# Patient Record
Sex: Female | Born: 1970 | Race: White | Hispanic: No | Marital: Single
Health system: Southern US, Community
[De-identification: ages and names within clinical notes are randomized; demographics above are authoritative.]

---

## 2011-04-01 ENCOUNTER — Inpatient Hospital Stay: Payer: Self-pay | Admitting: Unknown Physician Specialty

## 2011-07-06 ENCOUNTER — Emergency Department: Payer: Self-pay | Admitting: *Deleted

## 2011-09-15 ENCOUNTER — Emergency Department: Payer: Self-pay | Admitting: Emergency Medicine

## 2011-09-15 LAB — DRUG SCREEN, URINE
Amphetamines, Ur Screen: NEGATIVE (ref ?–1000)
Benzodiazepine, Ur Scrn: NEGATIVE (ref ?–200)
Cocaine Metabolite,Ur ~~LOC~~: NEGATIVE (ref ?–300)
MDMA (Ecstasy)Ur Screen: NEGATIVE (ref ?–500)
Opiate, Ur Screen: NEGATIVE (ref ?–300)
Phencyclidine (PCP) Ur S: NEGATIVE (ref ?–25)
Tricyclic, Ur Screen: NEGATIVE (ref ?–1000)

## 2011-09-15 LAB — ACETAMINOPHEN LEVEL: Acetaminophen: <2 ug/mL

## 2011-09-15 LAB — CBC
HCT: 43.1 % (ref 35.0–47.0)
MCH: 30.5 pg (ref 26.0–34.0)
MCHC: 33.4 g/dL (ref 32.0–36.0)
MCV: 91 fL (ref 80–100)
Platelet: 288 10*3/uL (ref 150–440)
RBC: 4.73 10*6/uL (ref 3.80–5.20)
WBC: 6.8 10*3/uL (ref 3.6–11.0)

## 2011-09-15 LAB — COMPREHENSIVE METABOLIC PANEL
Anion Gap: 11 (ref 7–16)
BUN: 12 mg/dL (ref 7–18)
Bilirubin,Total: 0.3 mg/dL (ref 0.2–1.0)
Calcium, Total: 9.4 mg/dL (ref 8.5–10.1)
Chloride: 101 mmol/L (ref 98–107)
Co2: 26 mmol/L (ref 21–32)
Creatinine: 0.82 mg/dL (ref 0.60–1.30)
EGFR (African American): >60
EGFR (Non-African Amer.): >60
Osmolality: 275 (ref 275–301)
Potassium: 4.4 mmol/L (ref 3.5–5.1)
Sodium: 138 mmol/L (ref 136–145)
Total Protein: 8 g/dL (ref 6.4–8.2)

## 2011-09-15 LAB — ETHANOL: Ethanol %: <0.003 % (ref 0.000–0.080)

## 2011-09-15 LAB — TSH: Thyroid Stimulating Horm: 2.14 u[IU]/mL

## 2012-08-21 IMAGING — CT CT HEAD WITHOUT CONTRAST
2 series · 16 of 30 positions shown, 20 images · non-contrast
Comparison: none

REASON FOR EXAM: + syncope
COMMENTS:

PROCEDURE:     CT  - CT HEAD WITHOUT CONTRAST  - July 06, 2011 [DATE]
RESULT:     Technique: Helical 5mm sections were obtained from the skull
base to the vertex without administration of intravenous contrast.

[Series 2: without · axial · non-contrast · 0.42mm/px · z∈[-33,+87]mm · 13 of 30 slices shown, 17 images]
[im 3/30  brain]
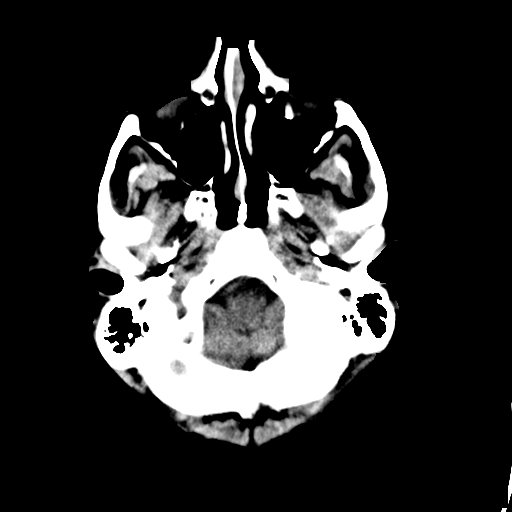
[im 3/30  bone]
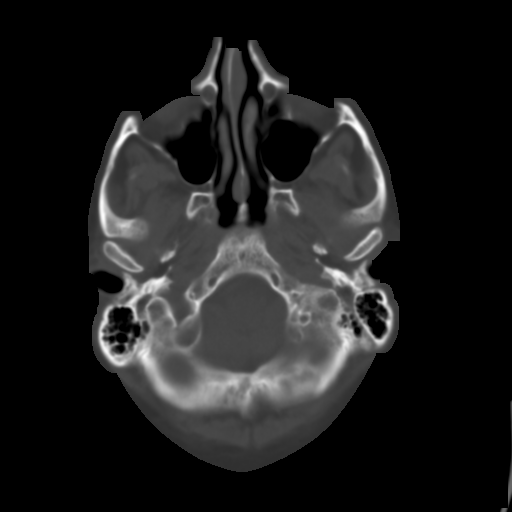
[im 5/30  brain]
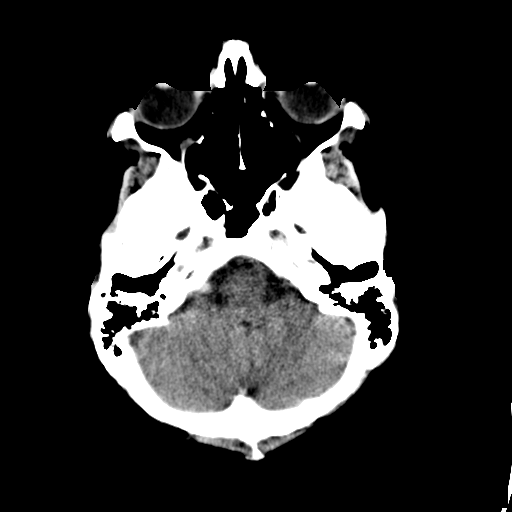
[im 7/30  brain]
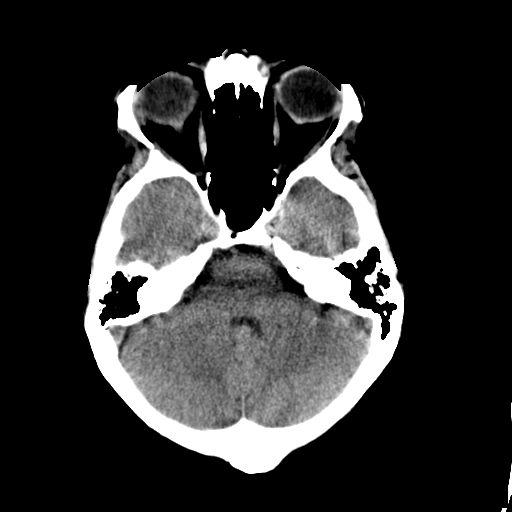
[im 9/30  brain]
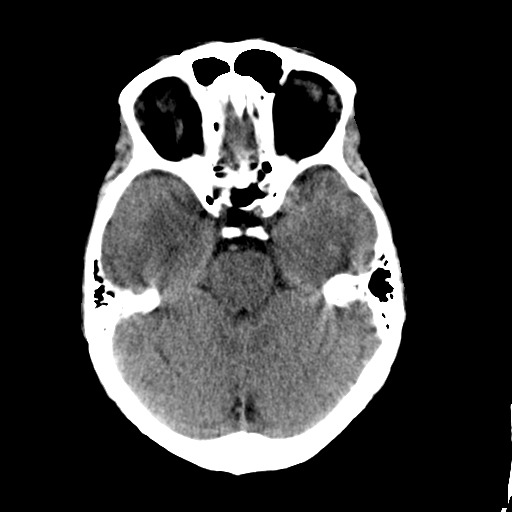
[im 11/30  brain]
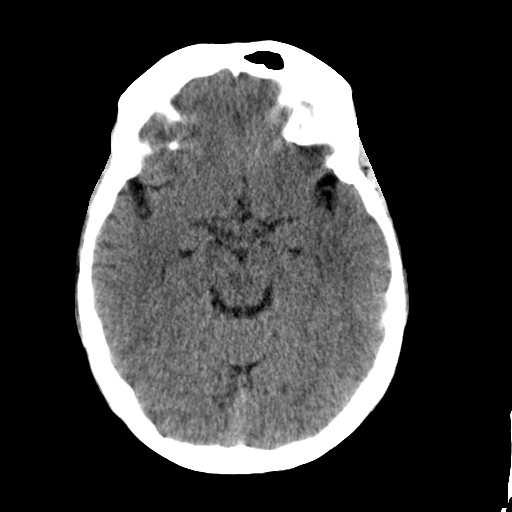
[im 11/30  bone]
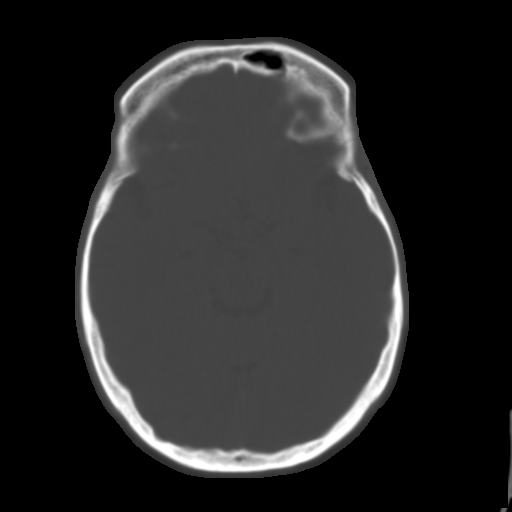
[im 13/30  brain]
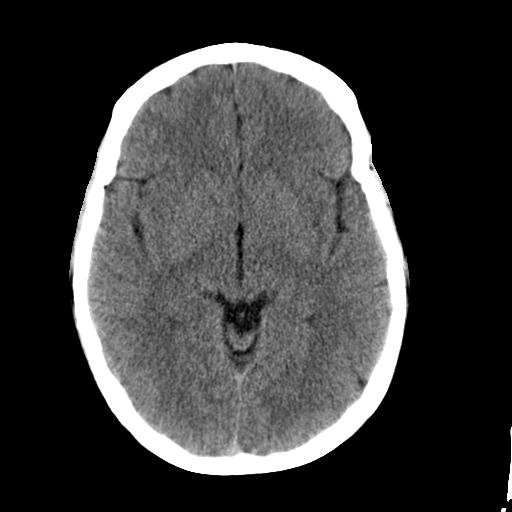
[im 15/30  brain]
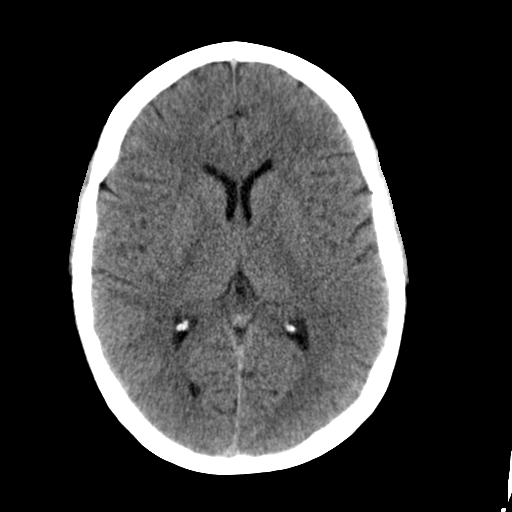
[im 17/30  brain]
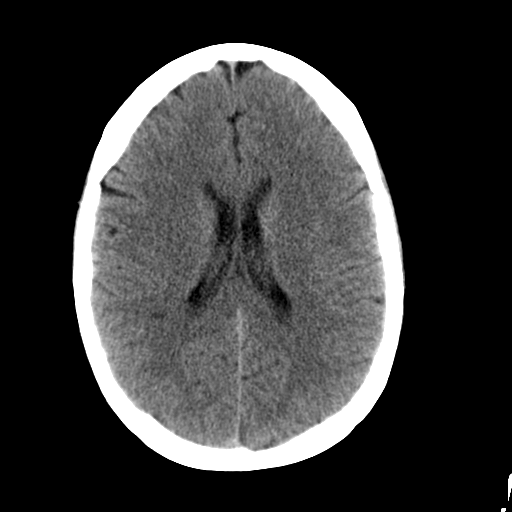
[im 19/30  brain]
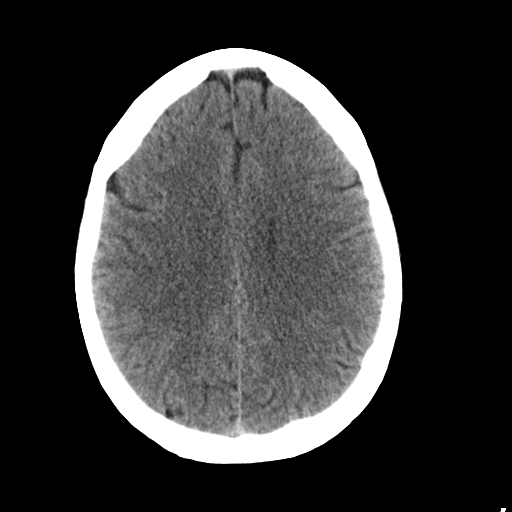
[im 19/30  bone]
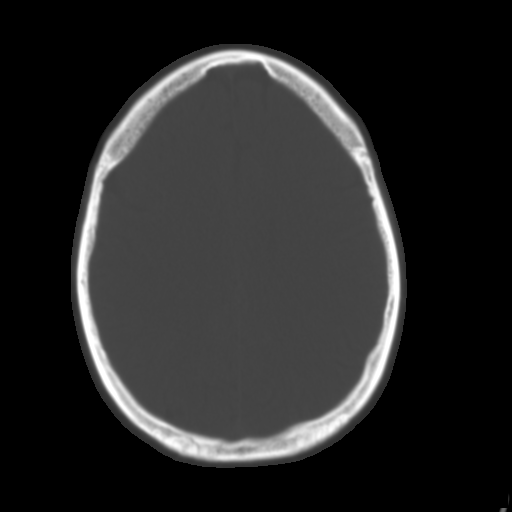
[im 21/30  brain]
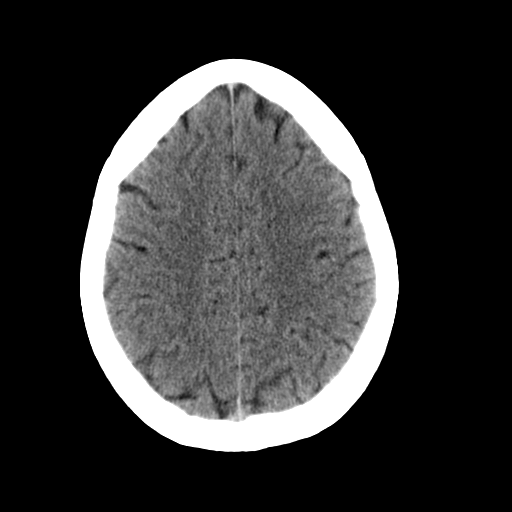
[im 23/30  brain]
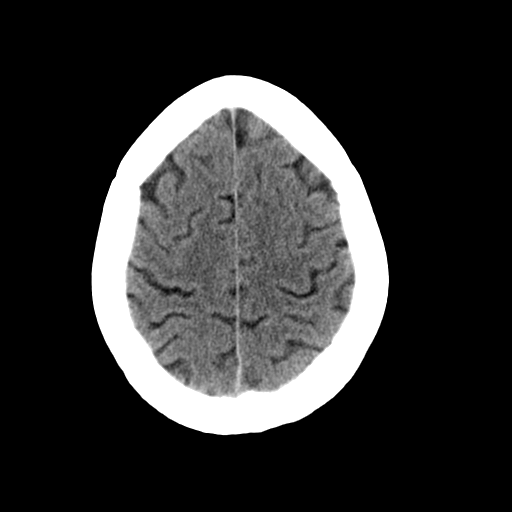
[im 25/30  brain]
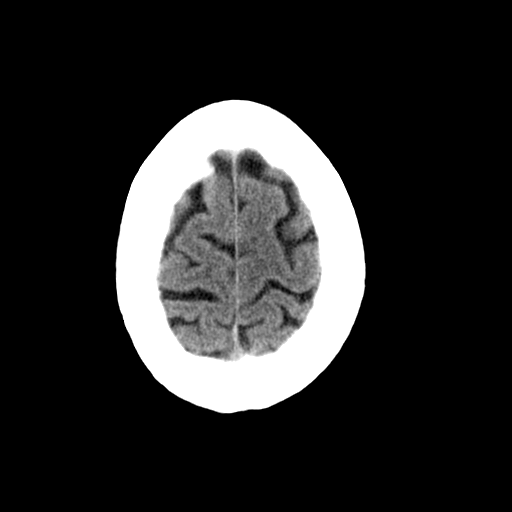
[im 27/30  brain]
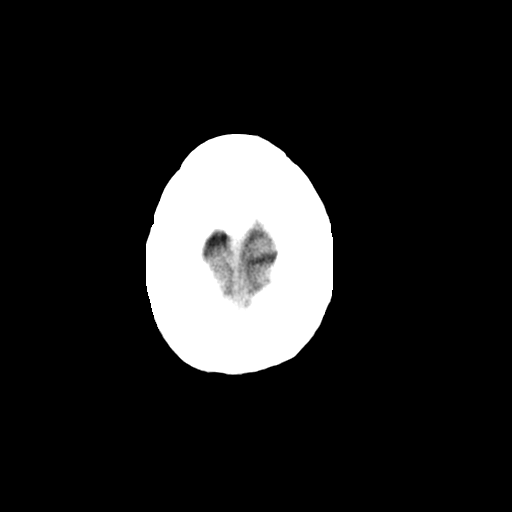
[im 27/30  bone]
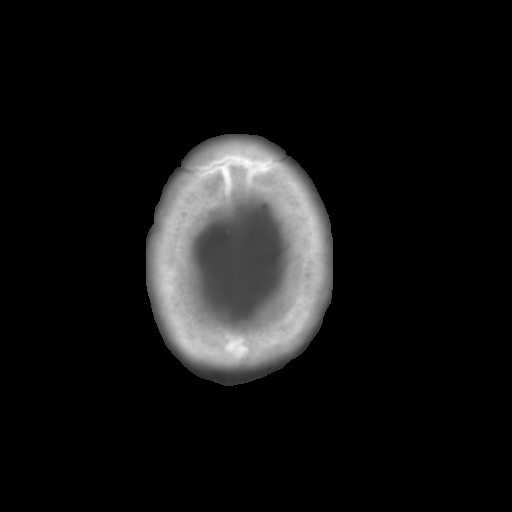

[Series 3: bone · axial · 0.42mm/px · z∈[-33,+7]mm · 3 of 30 slices shown]
[im 3/30  bone]
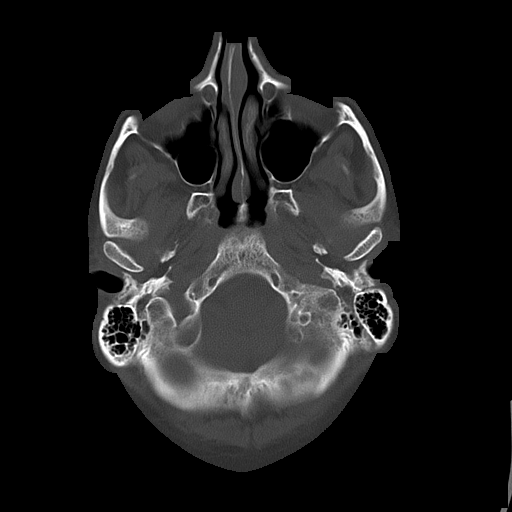
[im 7/30  bone]
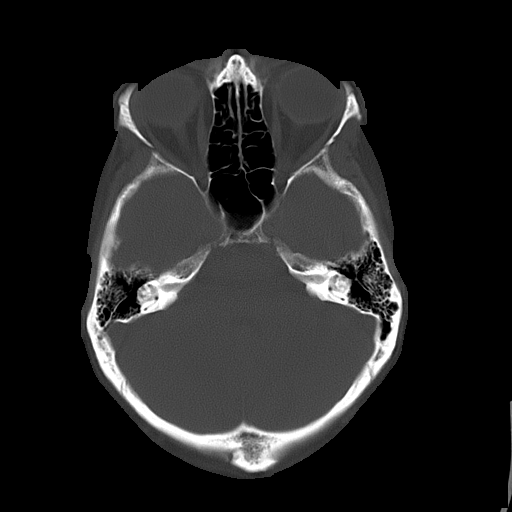
[im 11/30  bone]
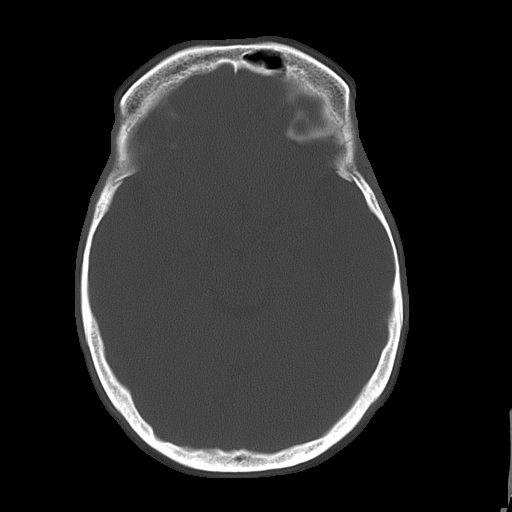

[16 of 30 positions shown; findings below may reference images not displayed]

FINDINGS: There is not evidence of intra-axial fluid collections. There is
no evidence of acute hemorrhage or secondary signs reflecting mass effect or
subacute or chronic focal territorial infarction. The osseous structures
demonstrate no evidence of a depressed skull fracture. If there is
persistent concern clinical follow-up with MRI is recommended.
IMPRESSION: 1. No evidence of acute intracranial abnormalitites.
2. Dr. Adenyo of the emergency department was informed of these findings
via a preliminary faxed report.

## 2014-12-16 NOTE — Consult Note (Signed)
Brief Consult Note: Diagnosis: Major depressive disorder recurrent severe.   Patient was seen by consultant.   Recommend further assessment or treatment.   Orders entered.   Comments: [name] has been hospitalized extensively all over the [place] for depression and anxiety. She was [facility] after [facility] month hospitalization. She came to the ER immediately following discharge when found herself w/o any resources and suicidal. She feels she is unable to care for herself.  MSE: Alert, oriented, pleasant, cooperative, just took a shower. Soft speech, depressed mood with reactive affect. She endorses suicidal ideations and is unable to contract for safety. She is not homicidal. There are no delusions, paranoia, AH, VH. Cognition impaired. She coplains of "memory lapses" and is "unable to retain any information". She remembers her friends' phone numbers. Her insight and judgment are impaired.  PLAN: 1. The patient is on a [facility].  2. She will continue all her medications.  3. I renew IVC.  4. I will follow up.  Electronic Signatures: Kristine LineaPucilowska, Magdiel Bartles (MD)  (Signed 29-Jan-13 20:37)  Authored: Brief Consult Note   Last Updated: 29-Jan-13 20:37 by Kristine LineaPucilowska, Dyquan Minks (MD)

## 2014-12-16 NOTE — Consult Note (Signed)
Brief Consult Note: Diagnosis: Major depressive disorder recurrent severe.   Patient was seen by consultant.   Consult note dictated.   Recommend further assessment or treatment.   Orders entered.   Discussed with Attending MD.   Comments: [name] has been hospitalized extensively all over the country for depression and anxiety. She was [facility]. She came to the ER immediately following discharge when found herself w/o any resources and suicidal. She feels she is unable to care for herself.  MSE: Alert, oriented, pleasant, cooperative, just took a shower. Soft speech, depressed mood with reactive affect. She endorses suicidal ideations and is unable to contract for safety. She is not homicidal. There are no delusions, paranoia, AH, VH. Cognition impaired. She coplains of "memory lapses" and is "unable to retain any information". She remembers her friends' phone numbers. Her insight and judgment are impaired.  PLAN: 1. The patient is on a [facility].  2. She will continue all her medications.  3. I will follow up.  Electronic Signatures: Kristine LineaPucilowska, Farheen Pfahler (MD)  (Signed 29-Jan-13 01:01)  Authored: Brief Consult Note   Last Updated: 29-Jan-13 01:01 by Kristine LineaPucilowska, Jaeden Messer (MD)

## 2014-12-16 NOTE — Consult Note (Signed)
Brief Consult Note: Diagnosis: Major depressive disorder recurrent severe.   Patient was seen by consultant.   Consult note dictated.   Recommend further assessment or treatment.   Orders entered.   Comments: [name] has been in the ER for over 40 hours. A consult oredr that should have been generated automatically was not ewntered.   [name] was [facility] after extensive, 2 month, hospitalization. She came to the ER immidiastely following discharge when found herself w/o any resources and suicidal. She feels she is unable to care for herself.  PLAN: 1. The patient is on a waiting liast for [facility].  2. She is to continue all her medications.  3. I will follow up.  Electronic Signatures: Kristine LineaPucilowska, Chizuko Trine (MD)  (Signed 24-Jan-13 22:00)  Authored: Brief Consult Note   Last Updated: 24-Jan-13 22:00 by Kristine LineaPucilowska, Eimy Plaza (MD)

## 2014-12-16 NOTE — Consult Note (Signed)
Brief Consult Note: Diagnosis: Major depressive disorder recurrent severe.   Patient was seen by consultant.   Consult note dictated.   Recommend further assessment or treatment.   Orders entered.   Discussed with Attending MD.   Comments: Kimberly Estes has been hospitalized extensively all over the country for depression and anxiety. [facility]. She came to the ER immediately following discharge when found herself w/o any resources and suicidal. She feels she is unable to care for herself.  MSE: Alert, oriented, pleasant, cooperative, just took a shower. Soft speech, depressed mood with reactive affect. She endorses suicidal ideations and is unable to contract for safety. She is not homicidal. There are no delusions, paranoia, AH, VH. Cognition impaired. She coplains of "memory lapses" and is "unable to retain any information". She remembers her friends' phone numbers. Her insight and judgment are impaired.  PLAN: 1. The patient is on a waiting list for [facility].  2. She will continue all her medications.  3. I will follow up.  Electronic Signatures: Kimberly Estes, Kimberly Estes (MD)  (Signed 25-Jan-13 11:17)  Authored: Brief Consult Note   Last Updated: 25-Jan-13 11:17 by Kimberly Estes, Kimberly Estes (MD)

## 2014-12-16 NOTE — Consult Note (Signed)
PATIENT NAME:  Kimberly Estes, Kimberly Estes MR#:  [id number] DATE OF BIRTH:  1970-11-03  DATE OF CONSULTATION:  09/17/2011  REFERRING PHYSICIAN:  Gaetano NetKevin Boland, DO CONSULTING PHYSICIAN:  Veldon Wager B. Kingstyn Deruiter, MD  REASON FOR CONSULTATION: To evaluate a suicidal patient.   IDENTIFYING DATA: Kimberly Estes is a 44 year old transgender female with a history of depression.   CHIEF COMPLAINT:  "I cannot make it."   HISTORY OF PRESENT ILLNESS: Kimberly Estes was [facility] in August and September of 2012 and [facility] where she spent another two months. She received ECT treatment there. One week following discharge she was in our Emergency Room again complaining of suicide thoughts. [facility] was [facility] and spent another two months there. She was discharged last Tuesday to the homeless shelter. She could not stay at the homeless shelter as she wanted to be housed with females and she was refused. She went to her [facility] with no follow-up plan. Halfway through the interview there she ran away and came to our Emergency Room. She reports thoughts of suicide and inability to care for herself. She has nowhere to go, has no resources. No way to obtain her medications. While at [facility], she realized that they have community transition unit there and her goal apparently is to stay in a hospital for two years now. She endorses all symptoms of depression with poor sleep, decreased appetite, anhedonia, social isolation, feelings of guilt, worthlessness, hopelessness, poor memory and concentration. She has dissociative episodes, but complains that she cannot retain any information and would not be able to take her prescribed medication even if she had access to them. She feels deeply disappointed and suicidal. She denies any drug or alcohol use since she was hospitalized up until recently.   PAST  PSYCHIATRIC HISTORY: There is long history of mood instability, depression, treated with ECT, psychotic features, PTSD She had several suicide attempts with [place]. [place], [place] and [place] now. She has been tried on multiple medications including nortriptyline, imipramine, clomipramine, Remeron, Geodon, Ritalin, Klonopin, bupropion, fluoxetine, Celexa, Cymbalta, Seroquel, Risperdal, Depakote, Lamictal, Zyprexa and Effexor. She also has been prescribed prazosin and Clozaril at [facility]. She reports that her mood has not improved at all during this extended hospitalization at the [facility].   FAMILY PSYCHIATRIC HISTORY: Mother with depression. Grandmother and grandfather with Alzheimer's.   PAST MEDICAL HISTORY: None.   ALLERGIES: Lamictal.   MEDICATIONS AT THE TIME OF ADMISSION:  1. Albuterol inhaler 2 puffs every six hours as needed.  2. Amitriptyline 100 mg at bedtime.  3. Premarin 1.25 mg twice daily.  4. Synthroid 0.05 mg in the morning. 5. Aldactone 50 mg every 12 hours.   SOCIAL HISTORY: She was born female but identifies herself as a female. No gender reassignment surgery was done. She started transitioning to transgender five or six years ago and has been using hormones for 3 to 4 years. 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 She has not been employed lately. She still puts out inquiries and [employer] contacted her, but she does not feel that she would be able to take a job and handle it at all. There is little social support in [place] as she is without means.   REVIEW OF SYSTEMS: CONSTITUTIONAL:  No fevers or chills. No weight changes. EYES: No double or blurred vision. ENT: No hearing loss. RESPIRATORY: No shortness of breath or cough. CARDIOVASCULAR: No chest pain or orthopnea.  GASTROINTESTINAL: No abdominal pain, nausea, vomiting, or diarrhea. GU: No incontinence or frequency. ENDOCRINE: No heat or cold intolerance. Positive for hypothyroidism. LYMPHATIC: No anemia or easy bruising. INTEGUMENTARY: No acne or rash. MUSCULOSKELETAL: No muscle or joint pain. NEUROLOGIC: No tingling or weakness. PSYCHIATRIC: See history of present illness for details.   PHYSICAL EXAMINATION:  VITAL SIGNS: Blood pressure 116/70, pulse 98, respirations 18, temperature 96.5.   GENERAL: This is a slender, tall, transgender female in no acute distress. Normal muscle strength in all extremities. Normal muscle tone. No stiffness or cogwheeling. No tremor. The rest of the physical examination is deferred to her primary attending.   LABORATORY DATA: Chemistries are within normal limits. Blood alcohol level is 0. LFTs within normal limits. TSH 2.14. Urine tox screen negative for substances. CBC within normal limits. Serum acetaminophen less than 2. Serum salicylates 1.7.   MENTAL STATUS EXAMINATION: The patient is alert and oriented to person, place, time, and situation. She is pleasant, polite, and cooperative. She maintains good eye contact. She is marginally groomed. Her speech is soft. Mood is depressed with flat affect. She is mute for part of the interview. Thought processing is logical with its own logic. She endorses suicidal ideation with a plan to overdose on medications or "in other way". There are no thoughts of hurting others. There are no delusions or paranoia. There are no obvious auditory or visual hallucinations. Her cognition is difficult to assess as she becomes mute when we attempt to do that. Her insight and judgment are questionable.   SUICIDE RISK ASSESSMENT: This is a patient with a long history of mood instability and multiple suicide attempts who was just discharged [facility] the state facility, but finds herself unable to press on.   ASSESSMENT:  AXIS I:   1. Major depressive disorder, recurrent, severe, without psychotic features.  2. Panic disorder without agoraphobia.  3. PTSD, chronic.   AXIS II: Borderline personality disorder.   AXIS III: Hypothyroidism.   AXIS IV: Transgender female, family conflict, employment, financial, housing, access to care, primary support.   AXIS V: GAF 30.     PLAN: The patient will be referred to [facility] hospitalization for consideration. We will continue all medications as prescribed at discharge from [facility]. I will follow-up.   ____________________________ Ellin Goodie. Jennet Maduro, MD jbp:ap D: 09/17/2011 18:29:46 ET T: 09/18/2011 07:34:51 ET JOB#: [id number]  cc: Ethyn Schetter B. Jennet Maduro, MD, <Dictator>  Shari Prows MD ELECTRONICALLY SIGNED 09/18/2011 22:40
